# Patient Record
Sex: Male | Born: 2000 | Hispanic: Yes | Marital: Single | Smoking: Never smoker
Health system: Southern US, Community
[De-identification: ages and names within clinical notes are randomized; demographics above are authoritative.]

---

## 2022-06-10 ENCOUNTER — Emergency Department (INDEPENDENT_AMBULATORY_CARE_PROVIDER_SITE_OTHER): Payer: No Typology Code available for payment source

## 2022-06-10 ENCOUNTER — Emergency Department
Admission: RE | Admit: 2022-06-10 | Discharge: 2022-06-10 | Disposition: A | Discharge status: Home / Self Care | Payer: No Typology Code available for payment source | Source: Ambulatory Visit

## 2022-06-10 VITALS — BP 126/78 | HR 92 | Temp 98.4°F | Resp 20 | Ht 67.0 in | Wt 170.0 lb

## 2022-06-10 DIAGNOSIS — M25562 Pain in left knee: Secondary | ICD-10-CM | POA: Diagnosis not present

## 2022-06-10 DIAGNOSIS — S83422A Sprain of lateral collateral ligament of left knee, initial encounter: Secondary | ICD-10-CM | POA: Diagnosis not present

## 2022-06-10 MED ORDER — NAPROXEN 500 MG PO TABS: 500.0000 mg | ORAL_TABLET | Freq: Two times a day (BID) | ORAL | 0 refills | Status: AC

## 2022-06-10 NOTE — Discharge Instructions (Signed)
Your xray does not show a fracture; it is normal. You have an LCL strain. This is a stretching of the stabilizing ligament of your ankle. Please wear the knee brace while up and moving around for the next two weeks. This will help stabilize your knee. You do not have to wear it while sedentary at home, in the shower or at night. Please ice your knee three times daily for the next 3 days. Take naproxen as needed for pain. If there is no pain, do not take it. Follow up with orthopedics or sports medicine if you are still having symptoms after 2 weeks of supportive care

## 2022-06-10 NOTE — ED Triage Notes (Signed)
Pt presents to Urgent Care with c/o pain to L knee following injury yesterday. Reports playing volleyball in the sand and twisting it upon landing. Limited ROM. Has not taken pain medication/NSAIDs.

## 2022-06-10 NOTE — ED Provider Notes (Signed)
[facility]    CSN: [id number] Arrival date & time: 06/10/22  1845      History   Chief Complaint Chief Complaint  Patient presents with   Knee Injury    HPI Edgar Mcfarland is a 21 y.o. male.   20yo otherwise healthy male presents today for evaluation of knee pain. Started yesterday after playing sand volleyball. He denies an exact injury, but states the pain has gradually gotten worse. He reports bending or twisting the knee makes it hurt at about a 6/10, but when it is not actively moving, the pain is a 0/10. He states up and down stairs also causes discomfort. Denies swelling, popping or clicking of knee. Tried his mom's neoprene knee brace, but this was too tight and caused a rash. Denies any additional complaints.     History reviewed. No pertinent past medical history.  There are no problems to display for this patient.   History reviewed. No pertinent surgical history.     Home Medications    Prior to Admission medications   Medication Sig Start Date End Date Taking? Authorizing Provider  naproxen (NAPROSYN) 500 MG tablet Take 1 tablet (500 mg total) by mouth 2 (two) times daily with a meal. As needed for knee pain 06/10/22  Yes Nyleah Mcginnis, Jodelle Gross, PA    Family History Family History  Problem Relation Age of Onset   Healthy Mother    Healthy Father     Social History Social History   Tobacco Use   Smoking status: Never   Smokeless tobacco: Never  Vaping Use   Vaping Use: Never used  Substance Use Topics   Alcohol use: Never   Drug use: Never     Allergies   Patient has no known allergies.   Review of Systems Review of Systems As per HPI  Physical Exam Triage Vital Signs ED Triage Vitals  Enc Vitals Group     BP 06/10/22 1909 126/78     Pulse Rate 06/10/22 1909 92     Resp 06/10/22 1909 20     Temp 06/10/22 1909 98.4 F (36.9 C)     Temp Source 06/10/22 1909 Oral     SpO2 06/10/22 1909 97 %     Weight 06/10/22 1906 170 lb  (77.1 kg)     Height 06/10/22 1906 5\' 7"  (1.702 m)     Head Circumference --      Peak Flow --      Pain Score 06/10/22 1906 3     Pain Loc --      Pain Edu? --      Excl. in GC? --    No data found.  Updated Vital Signs BP 126/78 (BP Location: Right Arm)   Pulse 92   Temp 98.4 F (36.9 C) (Oral)   Resp 20   Ht 5\' 7"  (1.702 m)   Wt 170 lb (77.1 kg)   SpO2 97%   BMI 26.63 kg/m   Visual Acuity Right Eye Distance:   Left Eye Distance:   Bilateral Distance:    Right Eye Near:   Left Eye Near:    Bilateral Near:     Physical Exam Vitals and nursing note reviewed.  Constitutional:      General: He is not in acute distress.    Appearance: Normal appearance. He is normal weight. He is not ill-appearing, toxic-appearing or diaphoretic.  HENT:     Head: Normocephalic and atraumatic.  Musculoskeletal:  General: Normal range of motion.     Right upper leg: Normal.     Left upper leg: Normal.     Right knee: Normal. No swelling, deformity, effusion, erythema, ecchymosis or lacerations. Normal range of motion. No tenderness. No medial joint line tenderness.     Left knee: No swelling, deformity, effusion, erythema, ecchymosis, lacerations, bony tenderness or crepitus. Normal range of motion. Tenderness present over the lateral joint line and LCL. LCL laxity present. No MCL laxity, ACL laxity or PCL laxity.Normal alignment, normal meniscus and normal patellar mobility. Normal pulse.     Instability Tests: Anterior drawer test negative. Posterior drawer test negative. Anterior Lachman test negative. Medial McMurray test negative and lateral McMurray test negative.     Right lower leg: Normal. No swelling, tenderness or bony tenderness. No edema.     Left lower leg: Normal. No swelling, tenderness or bony tenderness. No edema.       Legs:     Comments: Negative Apley Grind test  Neurological:     Mental Status: He is alert.      UC Treatments / Results  Labs (all labs  ordered are listed, but only abnormal results are displayed) Labs Reviewed - No data to display  EKG   Radiology DG Knee Complete 4 Views Left  Result Date: 06/10/2022 CLINICAL DATA:  Left knee pain, volleyball injury EXAM: LEFT KNEE - COMPLETE 4+ VIEW COMPARISON:  None Available. FINDINGS: No evidence of fracture, dislocation, or joint effusion. No evidence of arthropathy or other focal bone abnormality. Soft tissues are unremarkable. IMPRESSION: Negative. Electronically Signed   By: Charlett NoseKevin  Dover M.D.   On: 06/10/2022 19:22    Procedures Procedures (including critical care time)  Medications Ordered in UC Medications - No data to display  Initial Impression / Assessment and Plan / UC Course  I have reviewed the triage vital signs and the nursing notes.  Pertinent labs & imaging results that were available during my care of the patient were reviewed by me and considered in my medical decision making (see chart for details).     Sprain of L LCL - xray negative for acute findings. Slight laxity noted to LCL with reproducible pain. ICE and rest. NSAIDs as discussed. Hinged knee brace applied for added support. Limit sporting activities for 2 weeks, follow up with sports med if symptoms persist.  Final Clinical Impressions(s) / UC Diagnoses   Final diagnoses:  Sprain of lateral collateral ligament of left knee, initial encounter     Discharge Instructions      Your xray does not show a fracture; it is normal. You have an LCL strain. This is a stretching of the stabilizing ligament of your ankle. Please wear the knee brace while up and moving around for the next two weeks. This will help stabilize your knee. You do not have to wear it while sedentary at home, in the shower or at night. Please ice your knee three times daily for the next 3 days. Take naproxen as needed for pain. If there is no pain, do not take it. Follow up with orthopedics or sports medicine if you are still  having symptoms after 2 weeks of supportive care   ED Prescriptions     Medication Sig Dispense Auth. Provider   naproxen (NAPROSYN) 500 MG tablet Take 1 tablet (500 mg total) by mouth 2 (two) times daily with a meal. As needed for knee pain 20 tablet Jaycee Mckellips L, PA  PDMP not reviewed this encounter.   Maretta Bees, [place] 06/10/22 1951

## 2023-07-11 IMAGING — DX DG KNEE COMPLETE 4+V*L*
4 series · 4 of 4 positions shown · non-contrast
Comparison: None Available.

CLINICAL DATA: Left knee pain, volleyball injury

EXAM:
LEFT KNEE - COMPLETE 4+ VIEW

[knee ap]
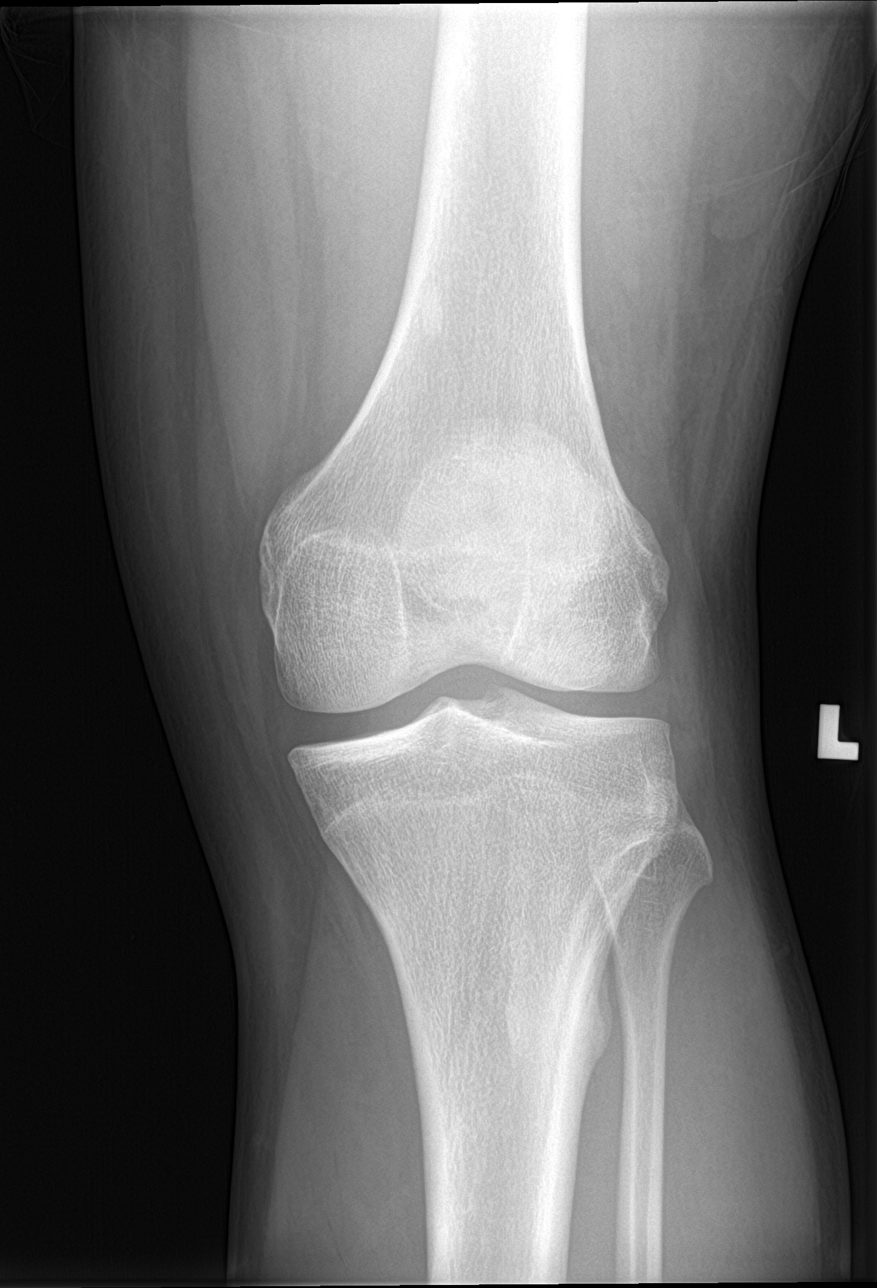

[knee obl (1 of 2)]
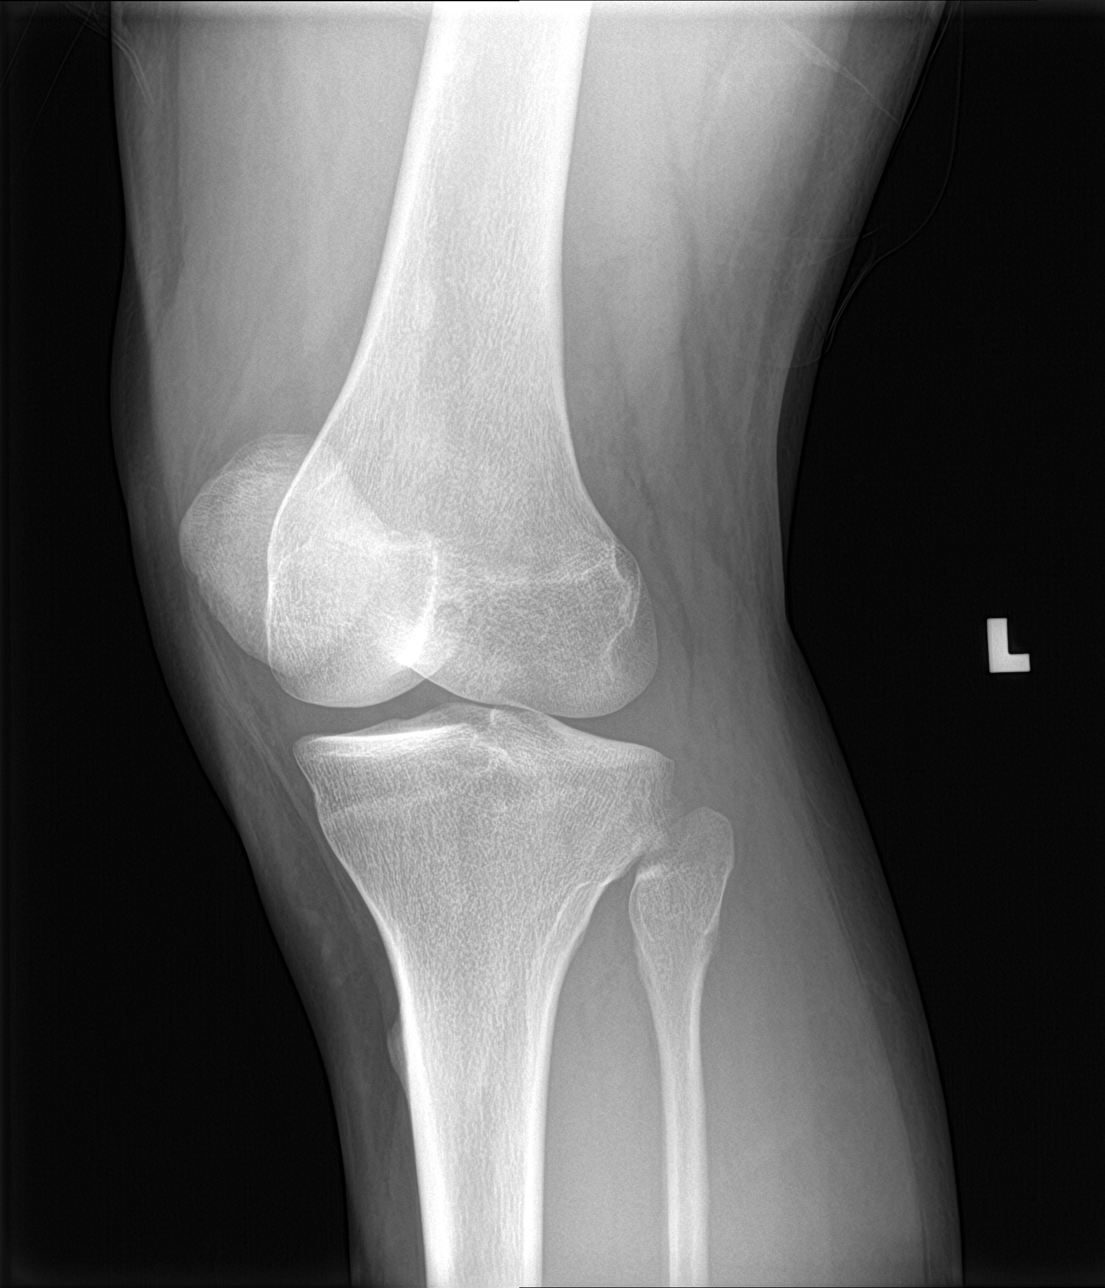

[knee lat]
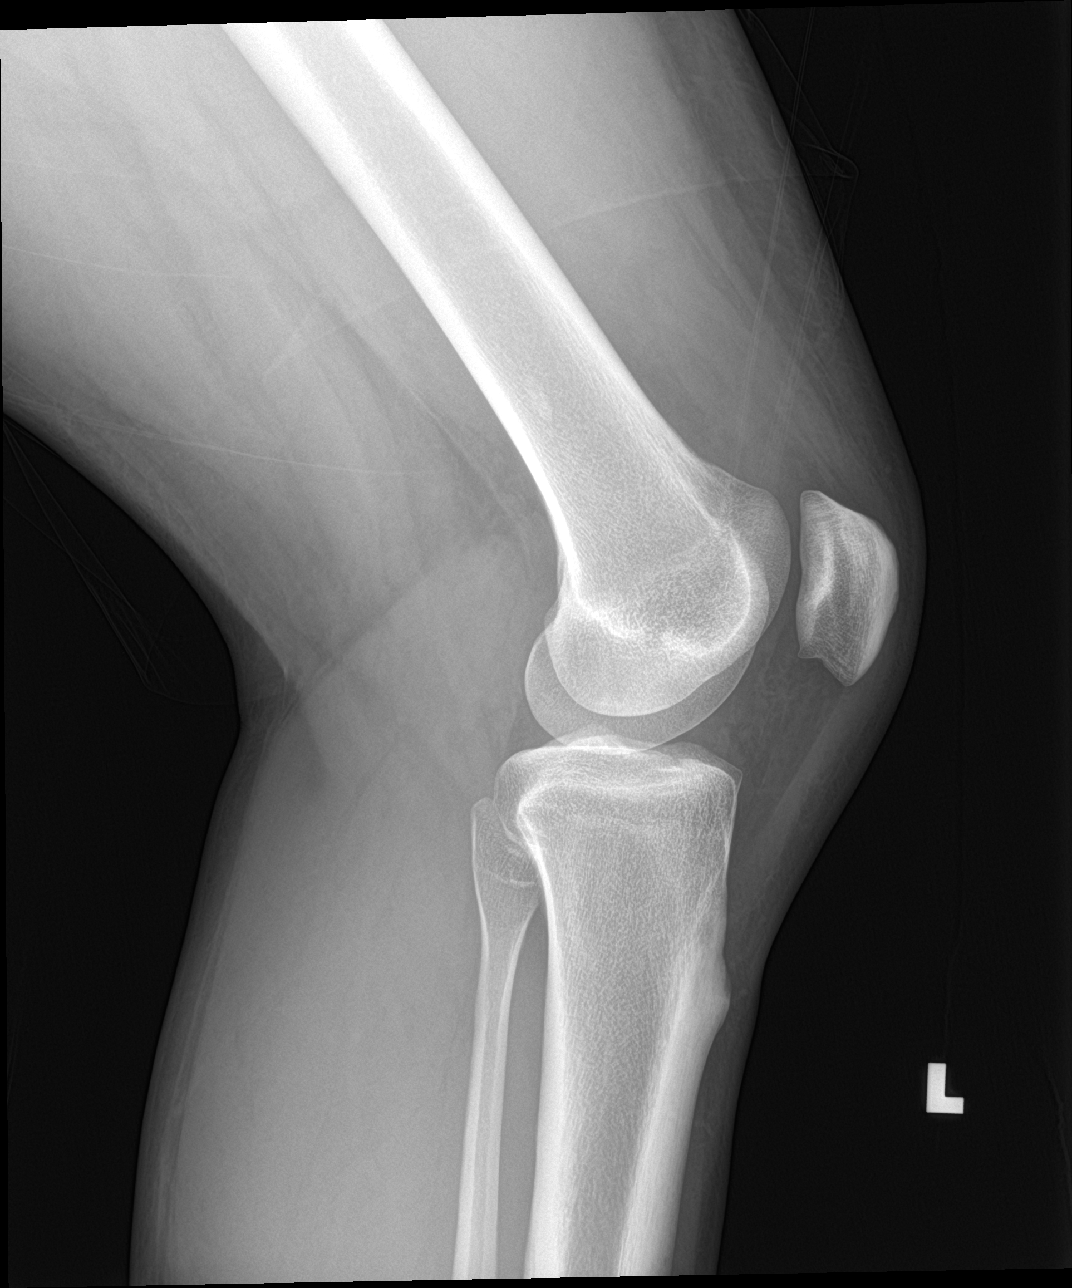

[knee obl (2 of 2)]
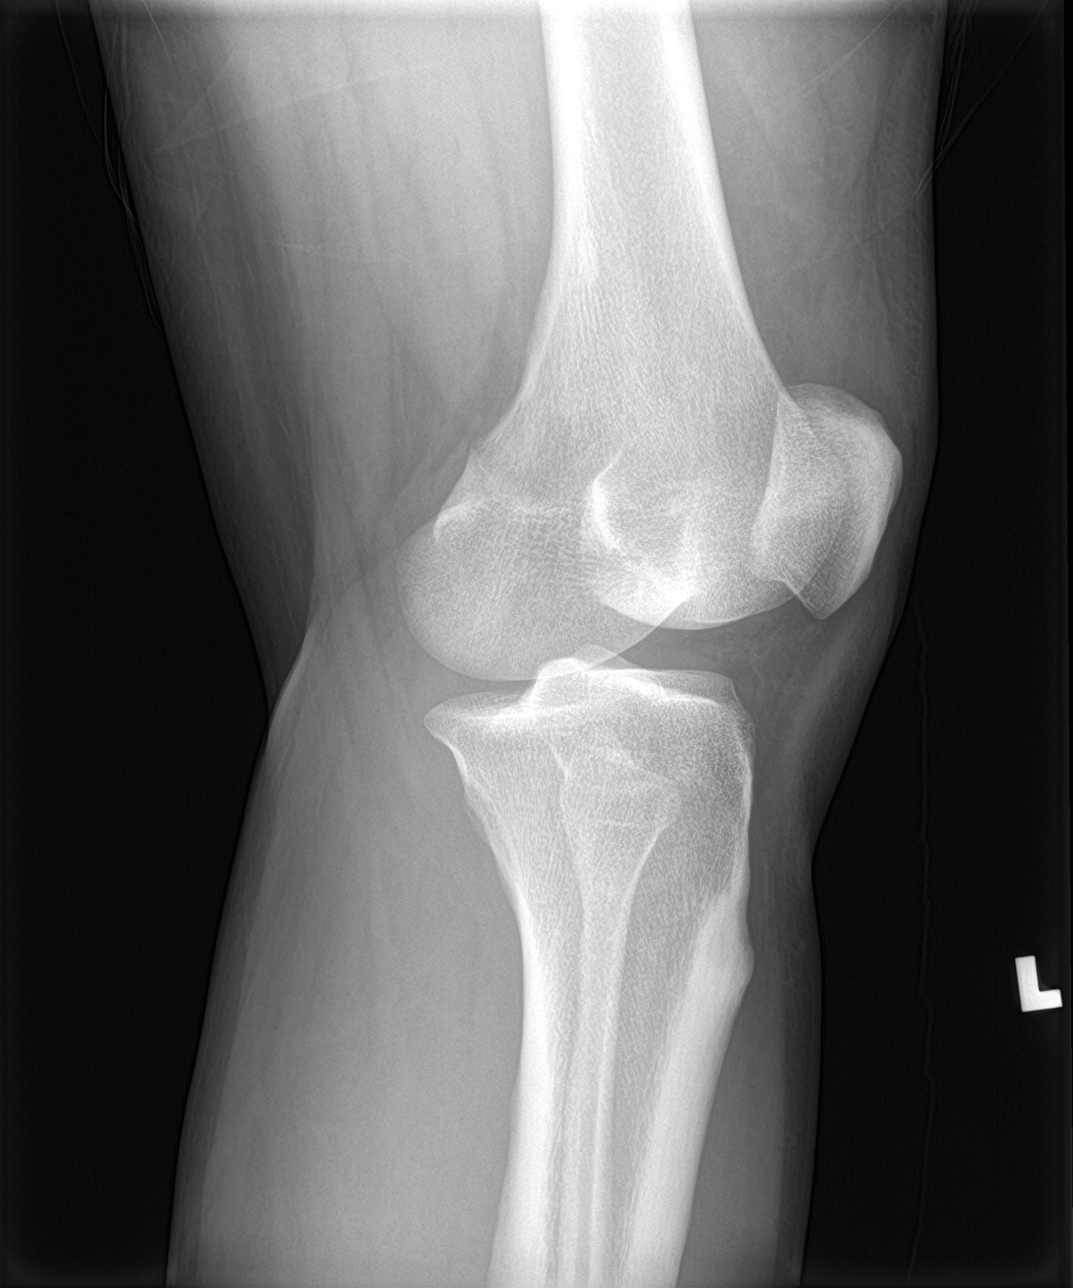

[4 of 4 positions shown; findings below may reference images not displayed]

FINDINGS: No evidence of fracture, dislocation, or joint effusion. No evidence
of arthropathy or other focal bone abnormality. Soft tissues are
unremarkable.
IMPRESSION: Negative.
